# Patient Record
Sex: Female | Born: 1945 | Race: White | Hispanic: No
Health system: Southern US, Community
[De-identification: ages and names within clinical notes are randomized; demographics above are authoritative.]

## PROBLEM LIST (undated history)

## (undated) DIAGNOSIS — H3530 Unspecified macular degeneration: Secondary | ICD-10-CM

## (undated) DIAGNOSIS — E079 Disorder of thyroid, unspecified: Secondary | ICD-10-CM

## (undated) DIAGNOSIS — T7840XA Allergy, unspecified, initial encounter: Secondary | ICD-10-CM

## (undated) HISTORY — DX: Unspecified macular degeneration: H35.30

## (undated) HISTORY — PX: KNEE ARTHROSCOPY W/ MENISCAL REPAIR: SHX1877

## (undated) HISTORY — PX: ABDOMINAL HYSTERECTOMY: SHX81

## (undated) HISTORY — DX: Disorder of thyroid, unspecified: E07.9

## (undated) HISTORY — DX: Allergy, unspecified, initial encounter: T78.40XA

---

## 2004-11-29 ENCOUNTER — Ambulatory Visit: Payer: Self-pay | Admitting: Internal Medicine

## 2006-01-13 ENCOUNTER — Ambulatory Visit: Payer: Self-pay | Admitting: Podiatry

## 2007-01-19 ENCOUNTER — Ambulatory Visit: Payer: Self-pay | Admitting: Orthopaedic Surgery

## 2007-01-25 ENCOUNTER — Ambulatory Visit: Payer: Self-pay | Admitting: Orthopaedic Surgery

## 2007-02-12 ENCOUNTER — Encounter: Payer: Self-pay | Admitting: Orthopaedic Surgery

## 2007-02-15 ENCOUNTER — Encounter: Payer: Self-pay | Admitting: Orthopaedic Surgery

## 2017-08-24 ENCOUNTER — Ambulatory Visit: Payer: BC Managed Care – PPO | Admitting: Oncology

## 2017-09-14 ENCOUNTER — Encounter: Payer: Self-pay | Admitting: Family Medicine

## 2018-09-06 ENCOUNTER — Encounter: Payer: BC Managed Care – PPO | Admitting: Oncology

## 2018-10-25 ENCOUNTER — Encounter: Payer: BC Managed Care – PPO | Admitting: Hematology and Oncology

## 2018-10-25 ENCOUNTER — Inpatient Hospital Stay: Payer: Medicare Other | Attending: Hematology and Oncology | Admitting: Hematology and Oncology

## 2018-10-25 ENCOUNTER — Encounter: Payer: Self-pay | Admitting: Hematology and Oncology

## 2018-10-25 ENCOUNTER — Other Ambulatory Visit: Payer: Self-pay

## 2018-10-25 ENCOUNTER — Inpatient Hospital Stay: Payer: Medicare Other

## 2018-10-25 VITALS — BP 148/92 | HR 76 | Temp 97.9°F | Resp 16 | Ht 64.0 in | Wt 181.7 lb

## 2018-10-25 DIAGNOSIS — H3530 Unspecified macular degeneration: Secondary | ICD-10-CM | POA: Diagnosis not present

## 2018-10-25 DIAGNOSIS — Z79899 Other long term (current) drug therapy: Secondary | ICD-10-CM | POA: Insufficient documentation

## 2018-10-25 DIAGNOSIS — D696 Thrombocytopenia, unspecified: Secondary | ICD-10-CM | POA: Insufficient documentation

## 2018-10-25 DIAGNOSIS — K219 Gastro-esophageal reflux disease without esophagitis: Secondary | ICD-10-CM | POA: Insufficient documentation

## 2018-10-25 DIAGNOSIS — Z7982 Long term (current) use of aspirin: Secondary | ICD-10-CM | POA: Insufficient documentation

## 2018-10-25 DIAGNOSIS — E063 Autoimmune thyroiditis: Secondary | ICD-10-CM | POA: Diagnosis not present

## 2018-10-25 DIAGNOSIS — R0609 Other forms of dyspnea: Secondary | ICD-10-CM | POA: Insufficient documentation

## 2018-10-25 LAB — TECHNOLOGIST SMEAR REVIEW

## 2018-10-25 LAB — PROTIME-INR
INR: 0.96
Prothrombin Time: 12.7 seconds (ref 11.4–15.2)

## 2018-10-25 LAB — CBC WITH DIFFERENTIAL/PLATELET
Abs Immature Granulocytes: 0.01 10*3/uL (ref 0.00–0.07)
Basophils Absolute: 0 10*3/uL (ref 0.0–0.1)
Basophils Relative: 1 %
EOS ABS: 0.1 10*3/uL (ref 0.0–0.5)
Eosinophils Relative: 2 %
HCT: 41.1 % (ref 36.0–46.0)
Hemoglobin: 14.1 g/dL (ref 12.0–15.0)
Immature Granulocytes: 0 %
Lymphocytes Relative: 23 %
Lymphs Abs: 0.9 10*3/uL (ref 0.7–4.0)
MCH: 31.3 pg (ref 26.0–34.0)
MCHC: 34.3 g/dL (ref 30.0–36.0)
MCV: 91.3 fL (ref 80.0–100.0)
Monocytes Absolute: 0.4 10*3/uL (ref 0.1–1.0)
Monocytes Relative: 9 %
Neutro Abs: 2.5 10*3/uL (ref 1.7–7.7)
Neutrophils Relative %: 65 %
Platelets: 149 10*3/uL — ABNORMAL LOW (ref 150–400)
RBC: 4.5 MIL/uL (ref 3.87–5.11)
RDW: 12.6 % (ref 11.5–15.5)
WBC: 3.9 10*3/uL — ABNORMAL LOW (ref 4.0–10.5)
nRBC: 0 % (ref 0.0–0.2)

## 2018-10-25 LAB — COMPREHENSIVE METABOLIC PANEL
ALT: 25 U/L (ref 0–44)
AST: 27 U/L (ref 15–41)
Albumin: 4.4 g/dL (ref 3.5–5.0)
Alkaline Phosphatase: 96 U/L (ref 38–126)
Anion gap: 8 (ref 5–15)
BUN: 18 mg/dL (ref 8–23)
CALCIUM: 9.1 mg/dL (ref 8.9–10.3)
CO2: 29 mmol/L (ref 22–32)
Chloride: 102 mmol/L (ref 98–111)
Creatinine, Ser: 0.73 mg/dL (ref 0.44–1.00)
GFR calc Af Amer: >60 mL/min (ref >60)
GFR calc non Af Amer: >60 mL/min (ref >60)
Glucose, Bld: 121 mg/dL — ABNORMAL HIGH (ref 70–99)
Potassium: 4.2 mmol/L (ref 3.5–5.1)
Sodium: 139 mmol/L (ref 135–145)
Total Bilirubin: 0.9 mg/dL (ref 0.3–1.2)
Total Protein: 7.4 g/dL (ref 6.5–8.1)

## 2018-10-25 LAB — RETICULOCYTES
Immature Retic Fract: 10.8 % (ref 2.3–15.9)
RBC.: 4.52 MIL/uL (ref 3.87–5.11)
Retic Count, Absolute: 90.4 10*3/uL (ref 19.0–186.0)
Retic Ct Pct: 2 % (ref 0.4–3.1)

## 2018-10-25 LAB — FOLATE: Folate: 43 ng/mL (ref >5.9)

## 2018-10-25 LAB — VITAMIN B12: Vitamin B-12: 571 pg/mL (ref 180–914)

## 2018-10-25 NOTE — Progress Notes (Signed)
[facility]-  [facility]cer Center  Clinic day:  10/25/2018  Chief Complaint: Colleen Mosley is a 73 y.o. female with thrombocytopenia who is referred in consultation by Dr. Leim Fabry for assessment and management.  HPI:   The patient has had a history of thrombocytopenia dating back to 03/25/2015.  Platelet count has ranged between 133,000 - 149,000.  She denies any bruising or bleeding.  CBC on 08/13/2018 revealed a hematocrit of 39.7, hemoglobin 13.6, MCV 92, platelets 137,000, WBC 4000 with an ANC of 2500.  Differential diagnosis included 64% segs, 21% lymphs, 12% monocytes,and 2% eosinophils.  CMP included a creatinine 0.7, albumin 4.1, protein 6.7, and normal LFTs.  TSH was 1.37.  B12 was 664 on 03/29/2018.  Hepatitis C antibody was negative on 03/12/2014.  Patient takes various nutritional supplement products. She denies any quinine water.  She is on Avastin injections for macular degeneration. She has hypothyroidism and takes levothyroxine (started as Hashimoto's).   She has chronic allergies and bronchitis.  Symptomatically, patient has exertional dyspnea. She notes more difficulties with hiking. Routine echocardiogram was done on 10/24/2018 by Dr. Lady Gary. Patient advises that she was told that it was "normal".  Patient has a history of recurrent upper respiratory infections.  She notes age related arthritic pain. Patient denies that she has experienced any B symptoms. She denies any interval infections.   Patient received a post-partum blood transfusion 50 years ago.   She has never had hepatitis.  Patient advises that she maintains an adequate appetite. She is eating well. Weight today is 181 lb 10.5 oz (82.4 kg).  Patient denies pain in the clinic today.   Past Medical History:  Diagnosis Date  . Allergy   . Macular degeneration   . Thyroid disease     Past Surgical History:  Procedure Laterality Date  . ABDOMINAL HYSTERECTOMY    . KNEE ARTHROSCOPY W/  MENISCAL REPAIR Right     Family History  Problem Relation Age of Onset  . Cancer Mother   . Cancer Maternal Aunt     Social History:  reports that she does not drink alcohol or use drugs. No history on file for tobacco.  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  She likes to go walking and hiking.  She has 3 dogs.  The patient lives in [place].  The patient is alone today.  Allergies:  Allergies  Allergen Reactions  . Coconut Fatty Acids Itching  . Fennel [Foeniculum Vulgare] Itching  . Iron Other (See Comments)    GI Upset  . Morphine Other (See Comments)    Hallucinations   . Rosemary Oil Itching  . Thyme Itching  . Other Nausea And Vomiting    Most narcotics  . Penicillins Rash    Current Medications: Current Outpatient Medications  Medication Sig Dispense Refill  . acetaminophen (TYLENOL) 325 MG tablet Take 650 mg by mouth every 6 (six) hours as needed.    Marland Kitchen acyclovir (ZOVIRAX) 400 MG tablet Take 400 mg by mouth as needed.     Marland Kitchen albuterol (PROVENTIL HFA;VENTOLIN HFA) 108 (90 Base) MCG/ACT inhaler Inhale into the lungs.    Marland Kitchen aspirin 81 MG chewable tablet Chew 81 mg by mouth daily.     Marland Kitchen atorvastatin (LIPITOR) 20 MG tablet Take 20 mg by mouth at bedtime.     . bevacizumab (AVASTIN) 400 MG/16ML SOLN 25 mg by Intravitreal route as directed.    . Cholecalciferol (VITAMIN D3 PO) Use as directed 2,000  Units in the mouth or throat daily.    . Digestive Enzymes (DIGESTIVE ENZYME PO) Use as directed 1 tablet in the mouth or throat 2 (two) times daily.    Marland Kitchen ipratropium (ATROVENT) 0.03 % nasal spray Place 2 sprays into the nose 2 (two) times daily.    Marland Kitchen levothyroxine (SYNTHROID, LEVOTHROID) 75 MCG tablet Take 75 mcg by mouth daily.     . montelukast (SINGULAIR) 10 MG tablet Take 1 tablet by mouth daily.    . Multiple Vitamin (MULTIVITAMIN) capsule Take 1 capsule by mouth daily.    . Multiple Vitamins-Minerals (PRESERVISION AREDS 2) CAPS Take 1 tablet by mouth 2 (two) times daily.     . Omega-3 1000 MG CAPS Take 1,000 mg by mouth daily.    Marland Kitchen omeprazole (PRILOSEC) 20 MG capsule Take 20 mg by mouth as needed.     . traZODone (DESYREL) 50 MG tablet Take 50 mg by mouth at bedtime as needed.     . triamcinolone cream (KENALOG) 0.1 % Apply 1 application topically as needed.    . Calcium-Magnesium-Vitamin D (CALCIUM 1200+D3 PO) Take 1 tablet by mouth daily.    Marland Kitchen zinc gluconate 50 MG tablet Take 50 mg by mouth daily.     No current facility-administered medications for this visit.     Review of Systems:  GENERAL:  Feels good.  Active.  No fevers, sweats.  Trying to lose weight. PERFORMANCE STATUS (ECOG):  1 HEENT:  Allergies.  No visual changes, sore throat, mouth sores or tenderness. Lungs: Shortness of breath with exertion.  No cough.  No hemoptysis. Cardiac:  No chest pain, palpitations, orthopnea, or PND.  Echo "fine". GI:  Reflux.  No nausea, vomiting, diarrhea, constipation, melena or hematochezia.  Colonoscopy 1 year ago. GU:  No urgency, frequency, dysuria, or hematuria. Musculoskeletal:  No back pain.  Arthritis right foot, hip, and hands (right > left).  No muscle tenderness.  Osteopenia. Extremities:  No pain or swelling. Skin:  No rashes or skin changes. Neuro:  Headache at times.  No numbness or weakness, balance or coordination issues. Endocrine:  No diabetes.  Hashimoto's thyroiditis on Synthroid.  No hot flashes or night sweats. Psych:  No mood changes, depression or anxiety. Pain:  No focal pain. Review of systems:  All other systems reviewed and found to be negative.  Physical Exam: Blood pressure (!) 148/92, pulse 76, temperature 97.9 F (36.6 C), temperature source Tympanic, resp. rate 16, height 5\' 4"  (1.626 m), weight 181 lb 10.5 oz (82.4 kg), SpO2 97 %. GENERAL:  Well developed, well nourished, woman sitting comfortably in the exam room in no acute distress. MENTAL STATUS:  Alert and oriented to person, place and time. HEAD:  Long dark hair.   Normocephalic, atraumatic, face symmetric, no Cushingoid features. EYES:  Glasses.  Blue eyes.  Pupils equal round and reactive to light and accomodation.  No conjunctivitis or scleral icterus. ENT:  Oropharynx clear without lesion.  Tongue normal. Mucous membranes moist.  RESPIRATORY:  Clear to auscultation without rales, wheezes or rhonchi. CARDIOVASCULAR:  Regular rate and rhythm without murmur, rub or gallop. ABDOMEN:  Soft, non-tender, with active bowel sounds, and no hepatosplenomegaly.  No masses. SKIN:  No rashes, ulcers or lesions. EXTREMITIES: No edema, no skin discoloration or tenderness.  No palpable cords. LYMPH NODES: No palpable cervical, supraclavicular, axillary or inguinal adenopathy. NEUROLOGICAL: Unremarkable. PSYCH:  Appropriate.   Office Visit on 10/25/2018  Component Date Value Ref Range Status  . Tech Review  10/25/2018 MORPHOLOGY UNREMARKABLE   Final   Performed at [facility], [address] [place], [place] [address]  . HCV Ab 10/25/2018 <0.1  0.0 - 0.9 s/co ratio Final   Comment: (NOTE)                                  Negative:     < 0.8                             Indeterminate: 0.8 - 0.9                                  Positive:     > 0.9 The CDC recommends that a positive HCV antibody result be followed up with a HCV Nucleic Acid Amplification test (604540). Performed At: [facility] [address] [place], [place] [id number] Jolene Schimke MD [id number]   . Hep B Core Total Ab 10/25/2018 Negative  Negative Final   Comment: (NOTE) Performed At: [facility] [address] [place], [place] [id number] Jolene Schimke MD [id number]   . HIV Screen 4th Generation wRfx 10/25/2018 Non Reactive  Non Reactive Final   Comment: (NOTE) Performed At: [facility] [address] [place], [place] [id number] Jolene Schimke MD [id number]   . Folate 10/25/2018 43.0  >5.9 ng/mL Final   Performed at [facility], [address] [place]., [place], [place] [address]  . Vitamin B-12 10/25/2018 571  180 - 914 pg/mL Final   Comment: (NOTE) This assay is not validated for testing neonatal or myeloproliferative syndrome specimens for Vitamin B12 levels. Performed at [facility], [address], [place], [place] [address]   . Anti Nuclear Antibody(ANA) 10/25/2018 Negative  Negative Final   Comment: (NOTE) Performed At: [facility] [address] [place], [place] [id number] Jolene Schimke MD [id number]   . Prothrombin Time 10/25/2018 12.7  11.4 - 15.2 seconds Final  . INR 10/25/2018 0.96   Final   Performed at [facility], [address] [place] [place], [place] [address]  . Sodium 10/25/2018 139  135 - 145 mmol/L Final  . Potassium 10/25/2018 4.2  3.5 - 5.1 mmol/L Final  . Chloride 10/25/2018 102  98 - 111 mmol/L Final  . CO2 10/25/2018 29  22 - 32 mmol/L Final  . Glucose, Bld 10/25/2018 121* 70 - 99 mg/dL Final  . BUN 42/70/6237 18  8 - 23 mg/dL Final  . Creatinine, Ser 10/25/2018 0.73  0.44 - 1.00 mg/dL Final  . Calcium 62/83/1517 9.1  8.9 - 10.3 mg/dL Final  . Total Protein 10/25/2018 7.4  6.5 - 8.1 g/dL Final  . Albumin 61/60/7371 4.4  3.5 - 5.0 g/dL Final  . AST 04/11/9484 27  15 - 41 U/L Final  . ALT 10/25/2018 25  0 - 44 U/L Final  . Alkaline Phosphatase 10/25/2018 96  38 - 126 U/L Final  . Total Bilirubin 10/25/2018 0.9  0.3 - 1.2 mg/dL Final  . GFR calc non Af Amer 10/25/2018 >60  >60 mL/min Final  . GFR calc Af Amer 10/25/2018 >60  >60 mL/min Final  . Anion gap 10/25/2018 8  5 - 15 Final   Performed at [facility], [address] [place], [place] [address]  . WBC 10/25/2018 3.9*  4.0 - 10.5 K/uL Final  . RBC 10/25/2018 4.50  3.87 - 5.11 MIL/uL Final  . Hemoglobin 10/25/2018 14.1  12.0 - 15.0 g/dL Final  . HCT 20/94/7096 41.1  36.0 - 46.0 % Final  . MCV 10/25/2018 91.3  80.0 - 100.0 fL Final  . MCH 10/25/2018 31.3  26.0 - 34.0 pg Final  .  MCHC 10/25/2018 34.3  30.0 - 36.0 g/dL Final  . RDW 28/36/6294 12.6  11.5 - 15.5 % Final  . Platelets 10/25/2018 149* 150 - 400 K/uL Final   PLATELET BY CITRATE NOT NECESSARY EDTA CLUMPING not OBSERVED.  Marland Kitchen nRBC 10/25/2018 0.0  0.0 - 0.2 % Final  . Neutrophils Relative % 10/25/2018 65  % Final  . Neutro Abs 10/25/2018 2.5  1.7 - 7.7 K/uL Final  . Lymphocytes Relative 10/25/2018 23  % Final  . Lymphs Abs 10/25/2018 0.9  0.7 - 4.0 K/uL Final  . Monocytes Relative 10/25/2018 9  % Final  . Monocytes Absolute 10/25/2018 0.4  0.1 - 1.0 K/uL Final  . Eosinophils Relative 10/25/2018 2  % Final  . Eosinophils Absolute 10/25/2018 0.1  0.0 - 0.5 K/uL Final  . Basophils Relative 10/25/2018 1  % Final  . Basophils Absolute 10/25/2018 0.0  0.0 - 0.1 K/uL Final  . Immature Granulocytes 10/25/2018 0  % Final  . Abs Immature Granulocytes 10/25/2018 0.01  0.00 - 0.07 K/uL Final   Performed at [facility], [address] [place], [place] [address]  . Retic Ct Pct 10/25/2018 2.0  0.4 - 3.1 % Final  . RBC. 10/25/2018 4.52  3.87 - 5.11 MIL/uL Final  . Retic Count, Absolute 10/25/2018 90.4  19.0 - 186.0 K/uL Final  . Immature Retic Fract 10/25/2018 10.8  2.3 - 15.9 % Final   Performed at [facility], [address], [place], [place] [address]  . H Pylori IgG 10/25/2018 0.27  0.00 - 0.79 Index Value Final   Comment: (NOTE)                             Negative           <0.80                             Equivocal    0.80 - 0.89                             Positive           >0.89 Performed At: [facility] [address] [place] [address] [place], [place] [id number] Jolene Schimke MD [id number]     Assessment:  Colleen Mosley is a 73 y.o. female with mild thrombocytopenia dating back to at least 03/25/2015.  Platelet count has ranged between 133,000 - 149,000 without trend.  She has a history of Hashimoto's thyroiditis.  She denies any new medications.  She takes some supplements.   She denies any history of hepatitis.  She had a blood transfusion 50 years ago.  She denies any alcohol.  Diet is good.  She has reflux.  Symptomatically, she denies any B symptoms.  She denies any bruising or bleeding.  Exam is reveals no adenopathy or hepatosplenomegaly.  Plan: 1.   Labs today:  CBC with diff, CMP, platelet count in a blue top tube, PT, PTT, ANA with reflex,  B12, folate, H pylori serologies, HIV testing, hepatitis B core antibody, hepatitis C antibody. 2.   Peripheral smear for path review. 3.   Mild thrombocytopenia  Etiology unclear.  Discuss differential diagnosis including pseudothrombocytopenia, vitamin deficiencies, autoimmune, infections (hepatitis, HIV, H pylori).  Etiology felt likely due to autoimmune disease secondary to history of Hashimoto's thyroiditis. 4.   RTC in 1 week for MD assessment and review of work-up.  Quentin MullingBryan Gray, NP  10/25/2018, 10:06 AM   I saw and evaluated the patient, participating in the key portions of the service and reviewing pertinent diagnostic studies and records.  I reviewed the nurse practitioner's note and agree with the findings and the plan.  The assessment and plan were discussed with the patient.  Multiple questions were asked by the patient and answered.   Nelva NayMelissa Corcoran, MD 10/25/2018,10:06 AM

## 2018-10-25 NOTE — Progress Notes (Signed)
Pt. Here for new patient visit d/t abnormal platelet counts, referred by Dr. Dayna Barker.

## 2018-10-26 LAB — ANA W/REFLEX: Anti Nuclear Antibody(ANA): NEGATIVE

## 2018-10-26 LAB — H. PYLORI ANTIBODY, IGG: H Pylori IgG: 0.27 Index Value (ref 0.00–0.79)

## 2018-10-26 LAB — HEPATITIS C ANTIBODY: HCV Ab: <0.1 s/co ratio (ref 0.0–0.9)

## 2018-10-26 LAB — HEPATITIS B CORE ANTIBODY, TOTAL: Hep B Core Total Ab: NEGATIVE

## 2018-10-26 LAB — HIV ANTIBODY (ROUTINE TESTING W REFLEX): HIV Screen 4th Generation wRfx: NONREACTIVE

## 2018-10-31 ENCOUNTER — Encounter: Payer: Self-pay | Admitting: Hematology and Oncology

## 2018-10-31 ENCOUNTER — Inpatient Hospital Stay: Payer: Medicare Other | Admitting: Hematology and Oncology

## 2018-10-31 VITALS — BP 138/77 | HR 80 | Temp 99.1°F | Resp 16 | Wt 183.0 lb

## 2018-10-31 DIAGNOSIS — D696 Thrombocytopenia, unspecified: Secondary | ICD-10-CM | POA: Diagnosis not present

## 2018-10-31 NOTE — Progress Notes (Signed)
Pt here for f/u. Denies any complaints/concerns at this time.

## 2018-10-31 NOTE — Progress Notes (Signed)
[facility]  Clinic day:  10/31/2018  Chief Complaint: Colleen [facility] is a 73 y.o. female with thrombocytopenia who is seen for review of work-up and discussion regarding direction of therapy.  HPI:  The patient was last seen in the hematology clinic on 10/25/2018 for initial consultation.  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  Platelet count has ranged between 133,000 - 149,000.  She denied any bruising or bleeding.  She denied any B symptoms.  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  Work-up revealed a hematocrit of 41.1, hemoglobin 14.1, MCV 91.3, platelets 149,000, WBC 3900 with an ANC of 2500.  Negative studies included: H pylori, HIV antibody, hepatitis B core antibody total, hepatitis C serologies.  B12 and folate were normal.  Retic was 2%.  ANA was negative. PT was 12.7 with an INR of 0.96.  CMP was normal.  Peripheral smear was unremarkable.  During the interim, patient doing well. She denies any acute concerns. No bleeding or unexplained bruising. Patient denies that she has experienced any B symptoms. She denies any interval infections.   Patient advises that she maintains an adequate appetite. She is eating well. Weight today is 182 lb 15.7 oz (83 kg), which compared to her last visit to the clinic, represents a 1 pound increase.   Patient denies pain in the clinic today.   Past Medical History:  Diagnosis Date  . Allergy   . Macular degeneration   . Thyroid disease     Past Surgical History:  Procedure Laterality Date  . ABDOMINAL HYSTERECTOMY    . KNEE ARTHROSCOPY W/ MENISCAL REPAIR Right     Family History  Problem Relation Age of Onset  . Cancer Mother   . Cancer Maternal Aunt     Social History:  reports that she does not drink alcohol or use drugs. No history on file for tobacco.  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  She likes to go walking and hiking.  She has 3 dogs.  The patient lives in  [place]. The patient is alone today.  Allergies:  Allergies  Allergen Reactions  . Coconut Fatty Acids Itching  . Fennel [Foeniculum Vulgare] Itching  . Iron Other (See Comments)    GI Upset  . Morphine Other (See Comments)    Hallucinations   . Rosemary Oil Itching  . Thyme Itching  . Other Nausea And Vomiting    Most narcotics  . Penicillins Rash    Current Medications: Current Outpatient Medications  Medication Sig Dispense Refill  . acetaminophen (TYLENOL) 325 MG tablet Take 650 mg by mouth every 6 (six) hours as needed.    Marland Kitchen. acyclovir (ZOVIRAX) 400 MG tablet Take 400 mg by mouth as needed.     Marland Kitchen. aspirin 81 MG chewable tablet Chew 81 mg by mouth daily.     Marland Kitchen. atorvastatin (LIPITOR) 20 MG tablet Take 20 mg by mouth at bedtime.     . bevacizumab (AVASTIN) 400 MG/16ML SOLN 25 mg by Intravitreal route as directed.    . Calcium-Magnesium-Vitamin D (CALCIUM 1200+D3 PO) Take 1 tablet by mouth daily.    . Cholecalciferol (VITAMIN D3 PO) Use as directed 2,000 Units in the mouth or throat daily.    . Digestive Enzymes (DIGESTIVE ENZYME PO) Use as directed 1 tablet in the mouth or throat 2 (two) times daily.    Marland Kitchen. ipratropium (ATROVENT) 0.03 % nasal spray Place 2 sprays into the nose 2 (two)  times daily.    Marland Kitchen levothyroxine (SYNTHROID, LEVOTHROID) 75 MCG tablet Take 75 mcg by mouth daily.     . montelukast (SINGULAIR) 10 MG tablet Take 1 tablet by mouth daily.    . Multiple Vitamin (MULTIVITAMIN) capsule Take 1 capsule by mouth daily.    . Multiple Vitamins-Minerals (PRESERVISION AREDS 2) CAPS Take 1 tablet by mouth 2 (two) times daily.    . Omega-3 1000 MG CAPS Take 1,000 mg by mouth daily.    . traZODone (DESYREL) 50 MG tablet Take 50 mg by mouth at bedtime as needed.     . zinc gluconate 50 MG tablet Take 50 mg by mouth daily.    Marland Kitchen albuterol (PROVENTIL HFA;VENTOLIN HFA) 108 (90 Base) MCG/ACT inhaler Inhale into the lungs.    Marland Kitchen omeprazole (PRILOSEC) 20 MG capsule Take 20 mg by mouth as  needed.     . triamcinolone cream (KENALOG) 0.1 % Apply 1 application topically as needed.     No current facility-administered medications for this visit.     Review of Systems:  GENERAL:  Feels good.  Active.  No fevers, sweats.  Weight up 1 pound. PERFORMANCE STATUS (ECOG):  0 HEENT:  Allergies.  No visual changes, sore throat, mouth sores or tenderness. Lungs: Shortness of breath with exertion.  No cough.  No hemoptysis. Cardiac:  No chest pain, palpitations, orthopnea, or PND. GI:  Reflux.  No nausea, vomiting, diarrhea, constipation, melena or hematochezia. GU:  No urgency, frequency, dysuria, or hematuria. Musculoskeletal:  No back pain.  Arthritis right foot, hip, and hands.  No muscle tenderness. Extremities:  No pain or swelling. Skin:  No rashes or skin changes. Neuro:  No headache, numbness or weakness, balance or coordination issues. Endocrine:  No diabetes.  Hashimoto's thyroiditis on Synthroid.  No hot flashes or night sweats. Psych:  No mood changes, depression or anxiety. Pain:  No focal pain. Review of systems:  All other systems reviewed and found to be negative.   Physical Exam: Blood pressure 138/77, pulse 80, temperature 99.1 F (37.3 C), temperature source Tympanic, resp. rate 16, weight 182 lb 15.7 oz (83 kg), SpO2 98 %. GENERAL:  Well developed, well nourished, woman sitting comfortably in the exam room in no acute distress. MENTAL STATUS:  Alert and oriented to person, place and time. HEAD:  Long dark hair.  Normocephalic, atraumatic, face symmetric, no Cushingoid features. EYES:  Glasses.  Blue eyes.  No conjunctivitis or scleral icterus. NEUROLOGICAL: Unremarkable. PSYCH:  Appropriate.    No visits with results within 3 Day(s) from this visit.  Latest known visit with results is:  Office Visit on 10/25/2018  Component Date Value Ref Range Status  . Tech Review 10/25/2018 MORPHOLOGY UNREMARKABLE   Final   Performed at [facility],  [address] [place], [place] [address]  . HCV Ab 10/25/2018 <0.1  0.0 - 0.9 s/co ratio Final   Comment: (NOTE)                                  Negative:     < 0.8                             Indeterminate: 0.8 - 0.9  Positive:     > 0.9 The CDC recommends that a positive HCV antibody result be followed up with a HCV Nucleic Acid Amplification test (185631). Performed At: [facility] [address] [place], [place] [id number] Jolene Schimke MD [id number]   . Hep B Core Total Ab 10/25/2018 Negative  Negative Final   Comment: (NOTE) Performed At: [facility] [address] [place], [place] [id number] Jolene Schimke MD [id number]   . HIV Screen 4th Generation wRfx 10/25/2018 Non Reactive  Non Reactive Final   Comment: (NOTE) Performed At: [facility] [address] [place], [place] [id number] Jolene Schimke MD [id number]   . Folate 10/25/2018 43.0  >5.9 ng/mL Final   Performed at [facility], [address] [place] [place], [place] [address]  . Vitamin B-12 10/25/2018 571  180 - 914 pg/mL Final   Comment: (NOTE) This assay is not validated for testing neonatal or myeloproliferative syndrome specimens for Vitamin B12 levels. Performed at [facility], [address] [place], [place] [address]   . Anti Nuclear Antibody(ANA) 10/25/2018 Negative  Negative Final   Comment: (NOTE) Performed At: [facility] [address] [place], [place] [id number] Jolene Schimke MD [id number]   . Prothrombin Time 10/25/2018 12.7  11.4 - 15.2 seconds Final  . INR 10/25/2018 0.96   Final   Performed at [facility], [address] [place] [place], [place] [address]  . Sodium 10/25/2018 139  135 - 145 mmol/L Final  . Potassium 10/25/2018 4.2  3.5 - 5.1 mmol/L Final  . Chloride 10/25/2018 102  98 - 111 mmol/L Final  . CO2 10/25/2018 29  22 - 32 mmol/L Final  . Glucose, Bld 10/25/2018 121* 70 - 99 mg/dL  Final  . BUN 07/09/3006 18  8 - 23 mg/dL Final  . Creatinine, Ser 10/25/2018 0.73  0.44 - 1.00 mg/dL Final  . Calcium 62/26/3335 9.1  8.9 - 10.3 mg/dL Final  . Total Protein 10/25/2018 7.4  6.5 - 8.1 g/dL Final  . Albumin 45/62/5638 4.4  3.5 - 5.0 g/dL Final  . AST 93/73/4287 27  15 - 41 U/L Final  . ALT 10/25/2018 25  0 - 44 U/L Final  . Alkaline Phosphatase 10/25/2018 96  38 - 126 U/L Final  . Total Bilirubin 10/25/2018 0.9  0.3 - 1.2 mg/dL Final  . GFR calc non Af Amer 10/25/2018 >60  >60 mL/min Final  . GFR calc Af Amer 10/25/2018 >60  >60 mL/min Final  . Anion gap 10/25/2018 8  5 - 15 Final   Performed at [facility], [address] [place], [place] [address]  . WBC 10/25/2018 3.9* 4.0 - 10.5 K/uL Final  . RBC 10/25/2018 4.50  3.87 - 5.11 MIL/uL Final  . Hemoglobin 10/25/2018 14.1  12.0 - 15.0 g/dL Final  . HCT 72/62/0355 41.1  36.0 - 46.0 % Final  . MCV 10/25/2018 91.3  80.0 - 100.0 fL Final  . MCH 10/25/2018 31.3  26.0 - 34.0 pg Final  . MCHC 10/25/2018 34.3  30.0 - 36.0 g/dL Final  . RDW 97/41/6384 12.6  11.5 - 15.5 % Final  . Platelets 10/25/2018 149* 150 - 400 K/uL Final   PLATELET BY CITRATE NOT NECESSARY EDTA CLUMPING not OBSERVED.  Marland Kitchen nRBC 10/25/2018 0.0  0.0 - 0.2 % Final  . Neutrophils Relative % 10/25/2018 65  % Final  . Neutro Abs 10/25/2018 2.5  1.7 - 7.7 K/uL Final  . Lymphocytes Relative 10/25/2018 23  %  Final  . Lymphs Abs 10/25/2018 0.9  0.7 - 4.0 K/uL Final  . Monocytes Relative 10/25/2018 9  % Final  . Monocytes Absolute 10/25/2018 0.4  0.1 - 1.0 K/uL Final  . Eosinophils Relative 10/25/2018 2  % Final  . Eosinophils Absolute 10/25/2018 0.1  0.0 - 0.5 K/uL Final  . Basophils Relative 10/25/2018 1  % Final  . Basophils Absolute 10/25/2018 0.0  0.0 - 0.1 K/uL Final  . Immature Granulocytes 10/25/2018 0  % Final  . Abs Immature Granulocytes 10/25/2018 0.01  0.00 - 0.07 K/uL Final   Performed at [facility], [address] [place], [place] [address]  . Retic Ct Pct 10/25/2018 2.0  0.4 - 3.1 % Final  . RBC. 10/25/2018 4.52  3.87 - 5.11 MIL/uL Final  . Retic Count, Absolute 10/25/2018 90.4  19.0 - 186.0 K/uL Final  . Immature Retic Fract 10/25/2018 10.8  2.3 - 15.9 % Final   Performed at [facility], [address] [place], [place] [address]  . H Pylori IgG 10/25/2018 0.27  0.00 - 0.79 Index Value Final   Comment: (NOTE)                             Negative           <0.80                             Equivocal    0.80 - 0.89                             Positive           >0.89 Performed At: [facility] [address] [place], [place] [id number] Jolene Schimke MD WG:[id number]     Assessment:  Lylia Karn is a 73 y.o. female with mild thrombocytopenia dating back to at least 03/25/2015.  Platelet count has ranged between 133,000 - 149,000 without trend.  Work-up on 10/25/2018 revealed a hematocrit of 41.1, hemoglobin 14.1, MCV 91.3, platelets 149,000, WBC 3900 with an ANC of 2500.  Negative studies included: H pylori, HIV antibody, hepatitis B core antibody total, hepatitis C serologies.  B12 and folate were normal.  Retic was 2%.  ANA was negative. PT was 12.7 with an INR of 0.96.  CMP was normal.  She has a history of Hashimoto's thyroiditis.  She denies any new medications.  She takes some supplements.  She denies any history of hepatitis.  She had a blood transfusion 50 years ago.  She denies any alcohol.  Diet is good.  She has reflux.  Symptomatically, she denies any complaint.  She denies any bleeding.  Exam is unremarkable.  Platelet count is 149,000.  Plan: 1.   Thrombocytopenia  Review work-up.  Labs unremarkable.  PTT inadvertantly not drawn.    Consider checking PTT at next lab draw.  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  Continue to monitor. 2.  RTC prn.   Quentin Mulling, NP  10/31/2018, 2:01 PM   I saw and evaluated the patient, participating in the key  portions of the service and reviewing pertinent diagnostic studies and records.  I reviewed the nurse practitioner's note and agree with the findings and the plan.  The assessment and plan were discussed with the patient.  Several questions were asked by the  patient and answered.   Nelva NayMelissa , MD 10/31/2018,2:01 PM

## 2018-12-21 ENCOUNTER — Encounter: Payer: Self-pay | Admitting: Family Medicine

## 2021-10-06 ENCOUNTER — Other Ambulatory Visit: Payer: Self-pay | Admitting: Otolaryngology

## 2021-10-06 DIAGNOSIS — H93A2 Pulsatile tinnitus, left ear: Secondary | ICD-10-CM

## 2021-11-03 ENCOUNTER — Other Ambulatory Visit: Payer: Medicare Other

## 2021-11-03 ENCOUNTER — Ambulatory Visit: Payer: Medicare PPO

## 2021-11-17 ENCOUNTER — Ambulatory Visit
Admission: RE | Admit: 2021-11-17 | Discharge: 2021-11-17 | Disposition: A | Discharge status: Home / Self Care | Payer: Medicare PPO | Source: Ambulatory Visit | Attending: Otolaryngology | Admitting: Otolaryngology

## 2021-11-17 ENCOUNTER — Other Ambulatory Visit: Payer: Self-pay

## 2021-11-17 DIAGNOSIS — H93A2 Pulsatile tinnitus, left ear: Secondary | ICD-10-CM | POA: Diagnosis present

## 2021-11-17 IMAGING — MR MR MRA HEAD W/O CM
1 series · 19 of 48 positions shown · IV contrast (gadavist)
Comparison: No pertinent prior exam.

CLINICAL DATA: Left-sided hearing loss with left-sided pulsatile
tinnitus for 9 months. Hypertension.

EXAM:
MRI HEAD WITHOUT AND WITH CONTRAST
MRA HEAD WITHOUT CONTRAST
TECHNIQUE: Multiplanar, multi-echo pulse sequences of the brain and surrounding
structures were acquired without and with intravenous contrast.
Angiographic images of the Circle of Willis were acquired using MRA
technique without intravenous contrast.
CONTRAST:  7mL GADAVIST GADOBUTROL 1 MMOL/ML IV SOLN

[Series 5: TOF · axial · 0.5mm · 0.41mm/px · z∈[-100,-3]mm · 19 of 205 slices shown]
[im 1/205]
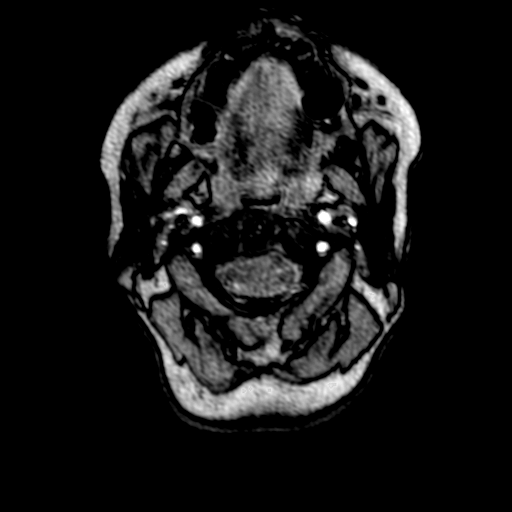
[im 5/205]
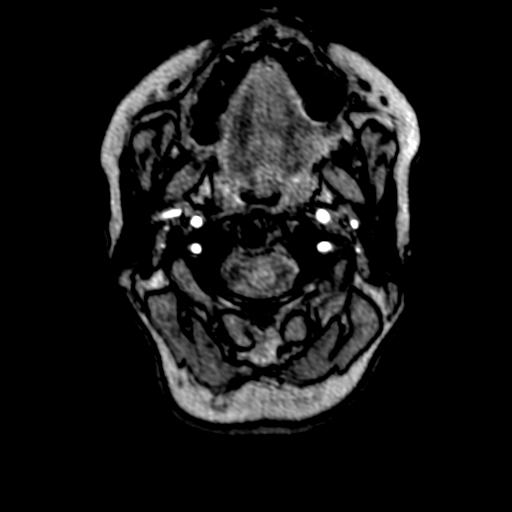
[im 9/205]
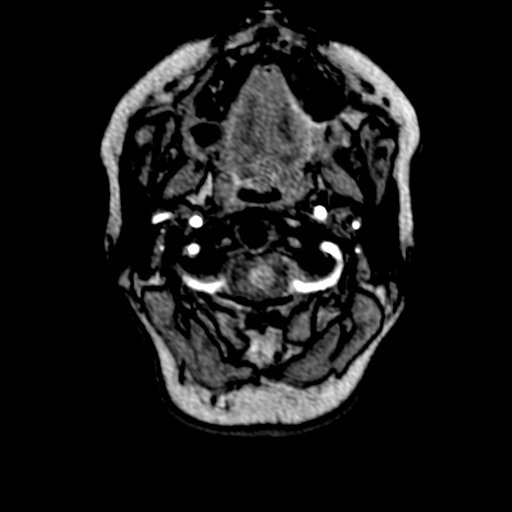
[im 14/205]
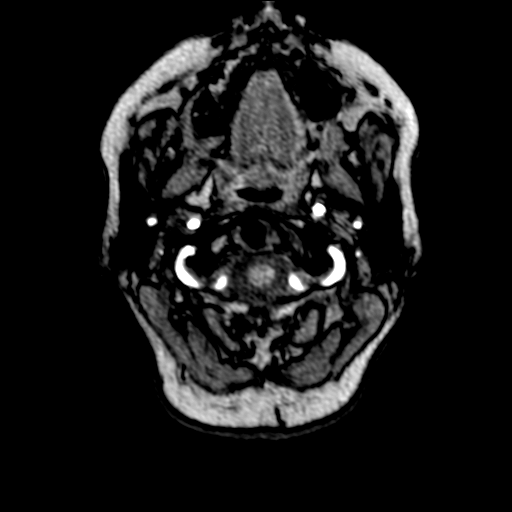
[im 18/205]
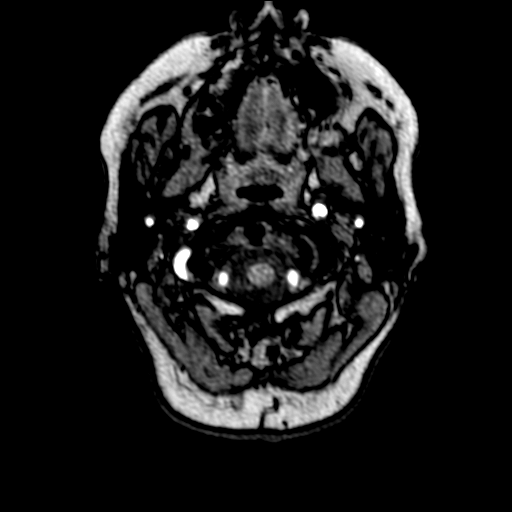
[im 22/205]
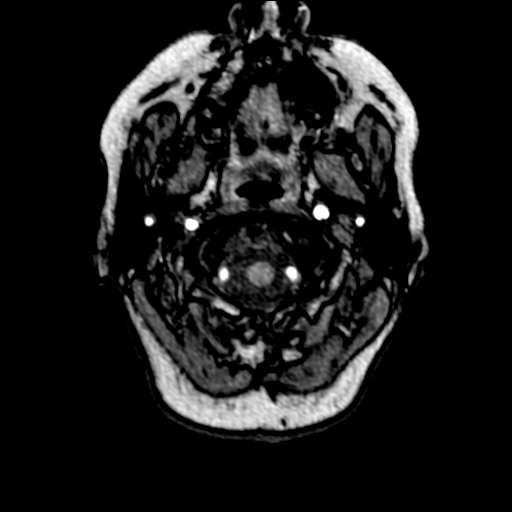
[im 27/205]
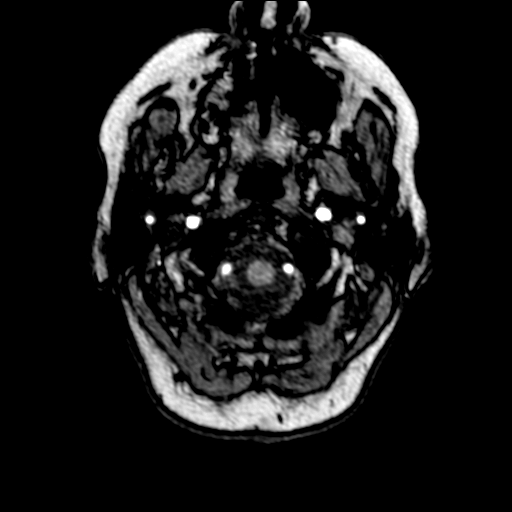
[im 31/205]
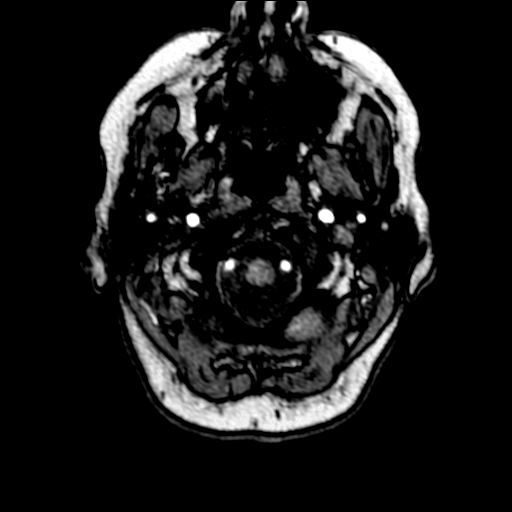
[im 35/205]
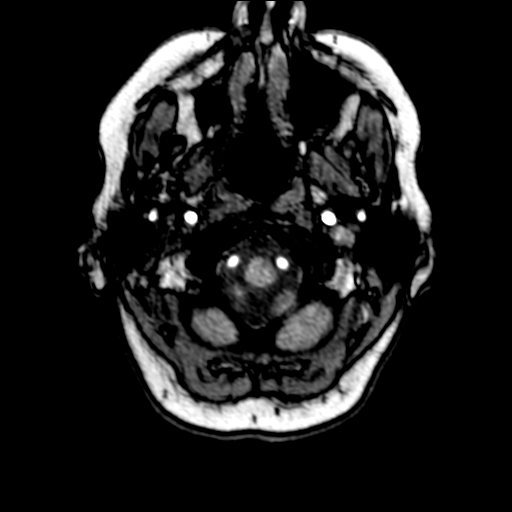
[im 40/205]
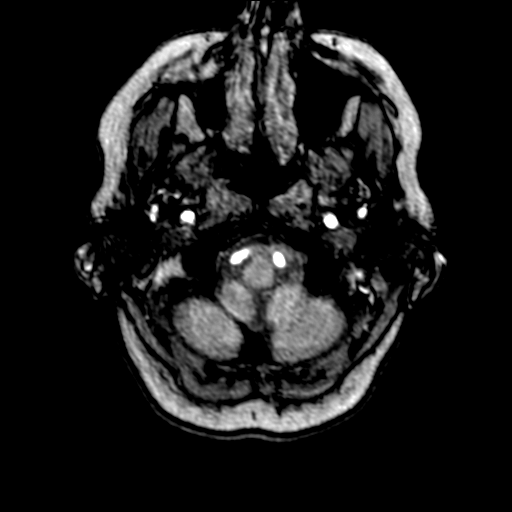
[im 44/205]
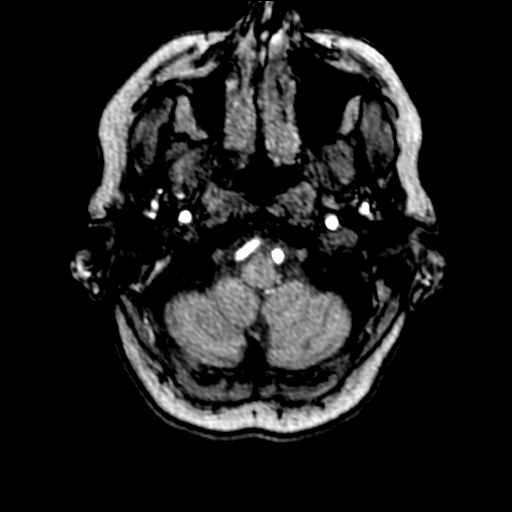
[im 66/205]
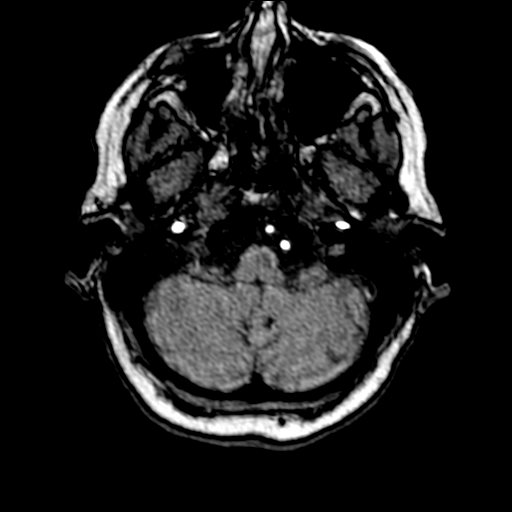
[im 92/205]
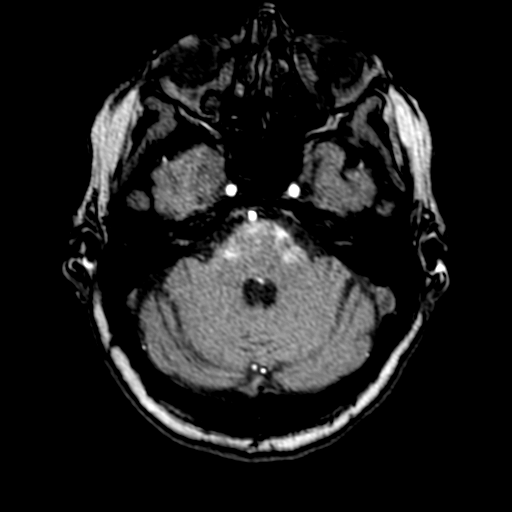
[im 105/205]
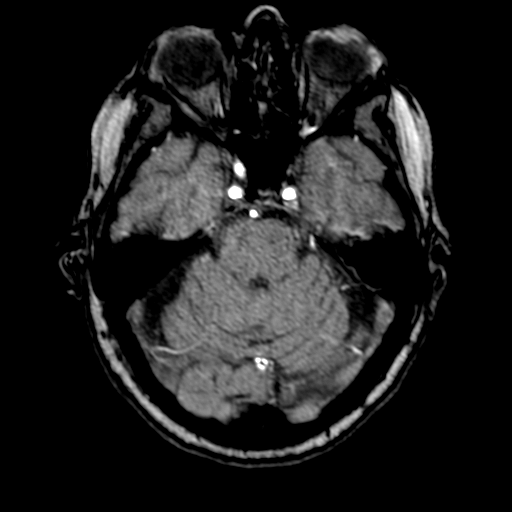
[im 118/205]
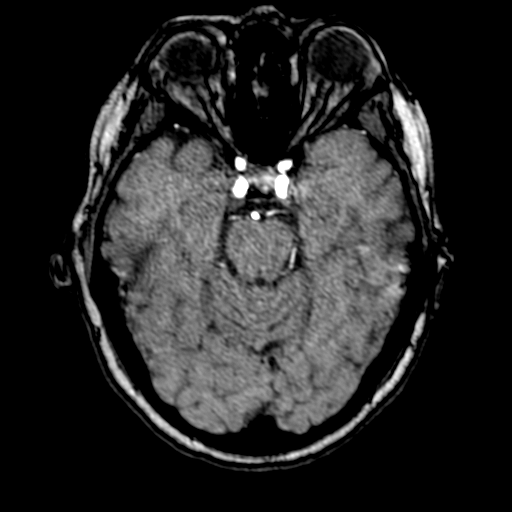
[im 144/205]
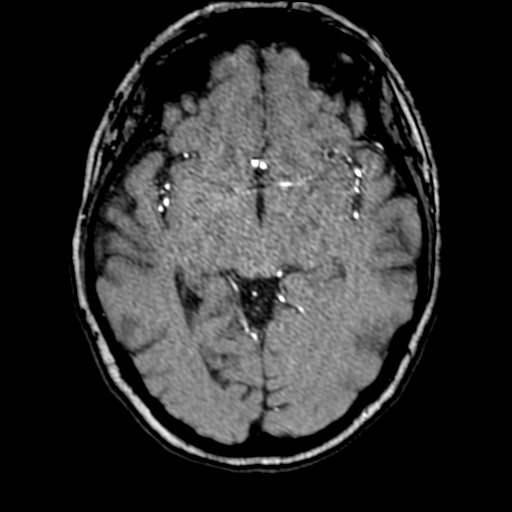
[im 170/205]
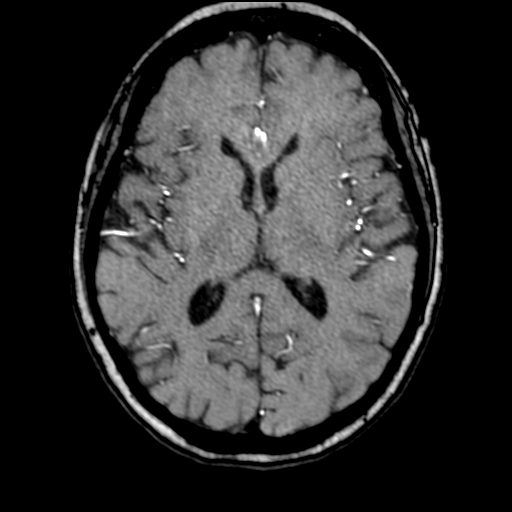
[im 174/205]
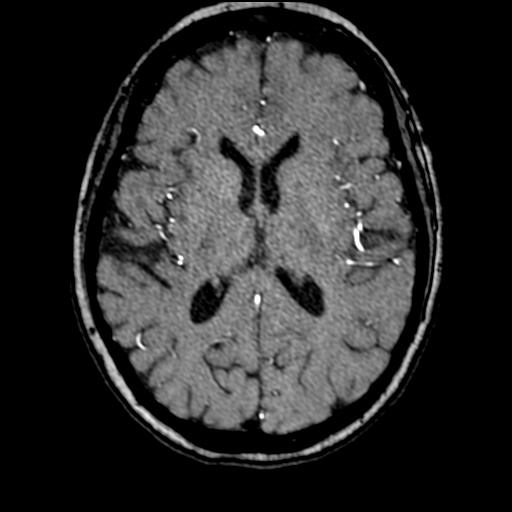
[im 196/205]
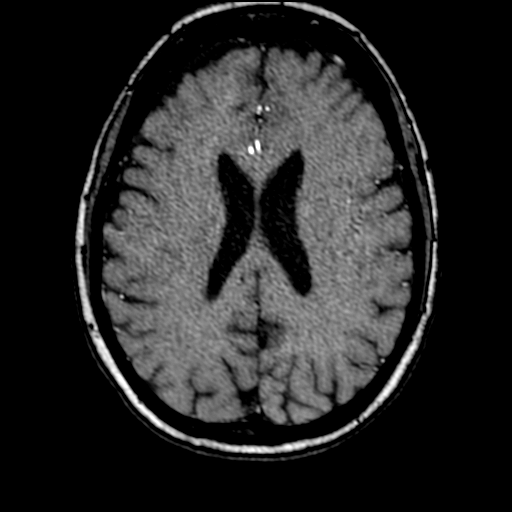

[19 of 48 positions shown; findings below may reference images not displayed]

FINDINGS: MRI HEAD FINDINGS

Brain: Symmetric normal labyrinthine signal. Normal appearance of
the brainstem and cisterns. No infarct, hemorrhage, hydrocephalus,
mass, or brain atrophy.

Vascular: Normal flow voids and vascular enhancements. No evidence
of dural sinus stenosis or diverticulum.

Skull and upper cervical spine: Normal marrow signal.

Sinuses/Orbits: Unremarkable

MRA HEAD FINDINGS

Anterior circulation: Vessels are smoothly contoured and widely
patent. Normal course of the carotids at the skull base. Negative
for aneurysm.

Posterior circulation: Codominant vertebral arteries. No aneurysm,
beading, or altered course.

No evidence of fistula.
IMPRESSION: No explanation for symptoms. No retrocochlear or vascular lesion as
a cause for tinnitus.

## 2021-11-17 IMAGING — MR MR HEAD WO/W CM
12 of 18 series · 28 of 48 positions shown · IV contrast (7ml Gadavist)
Comparison: No pertinent prior exam.

CLINICAL DATA: Left-sided hearing loss with left-sided pulsatile
tinnitus for 9 months. Hypertension.

EXAM:
MRI HEAD WITHOUT AND WITH CONTRAST
MRA HEAD WITHOUT CONTRAST
TECHNIQUE: Multiplanar, multi-echo pulse sequences of the brain and surrounding
structures were acquired without and with intravenous contrast.
Angiographic images of the Circle of Willis were acquired using MRA
technique without intravenous contrast.
CONTRAST:  7mL GADAVIST GADOBUTROL 1 MMOL/ML IV SOLN

[Series 5: ax dwi_tracew · axial · 3.0mm · 0.65mm/px · z∈[-91,+63]mm · 4 of 48 slices shown]
[im 1/48]
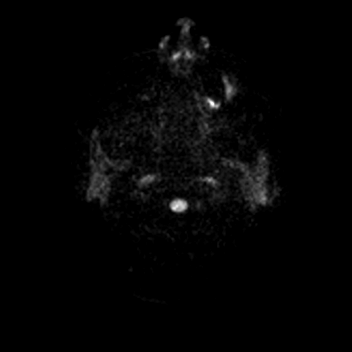
[im 16/48]
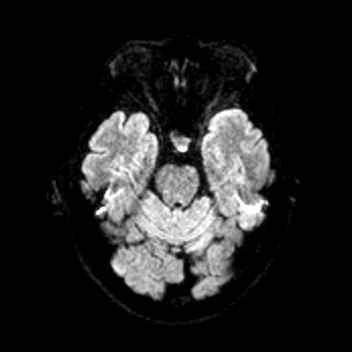
[im 32/48]
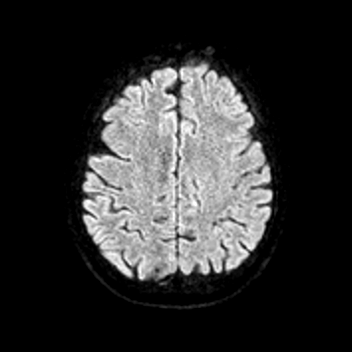
[im 48/48]
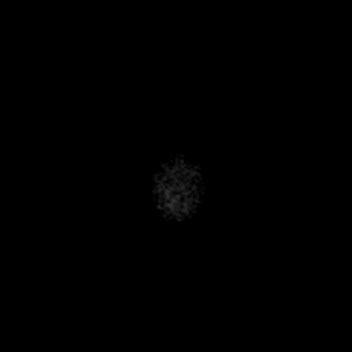

[Series 6: ax dwi_adc · axial · 3.0mm · 0.65mm/px · z∈[-91,+63]mm · 3 of 48 slices shown]
[im 1/48]
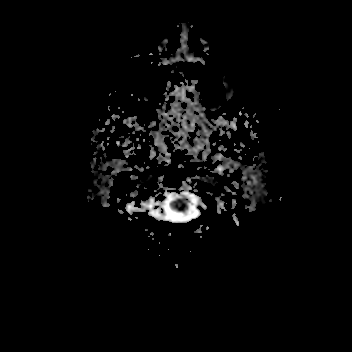
[im 24/48]
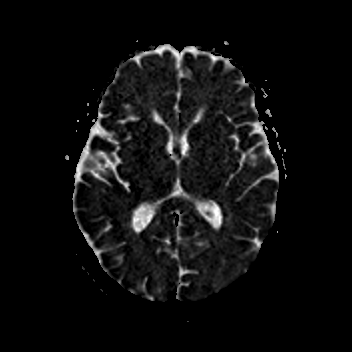
[im 48/48]
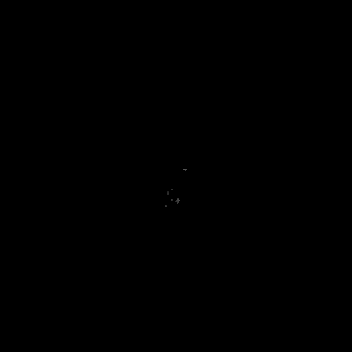

[Series 7: cor dwi_tracew · coronal · 5.0mm · 0.65mm/px · 2 of 40 slices shown]
[im 1/40]
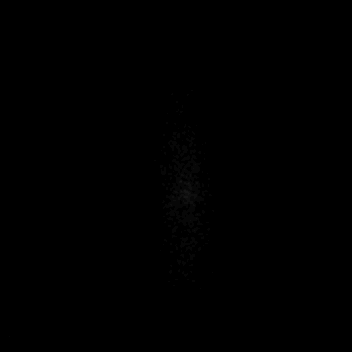
[im 40/40]
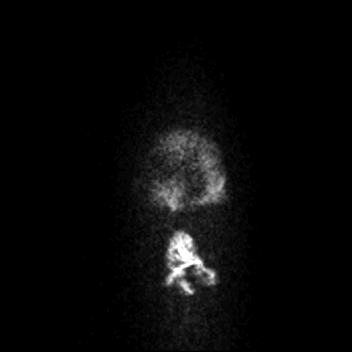

[Series 9: T1 · sagittal · 5.0mm · 0.62mm/px · 1 of 25 slices shown (1 of 3)]
[im 1/25]
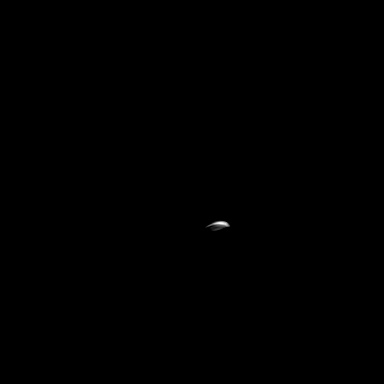

[Series 10: T2 · axial · 5.0mm · 0.53mm/px · 1 of 25 slices shown]
[im 1/25]
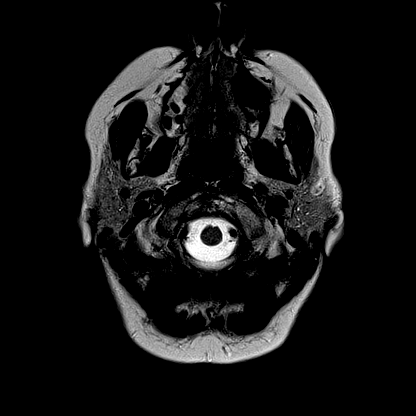

[Series 15: FLAIR · axial · 3.0mm · 0.53mm/px · z∈[-95,+66]mm · 3 of 55 slices shown]
[im 1/55]
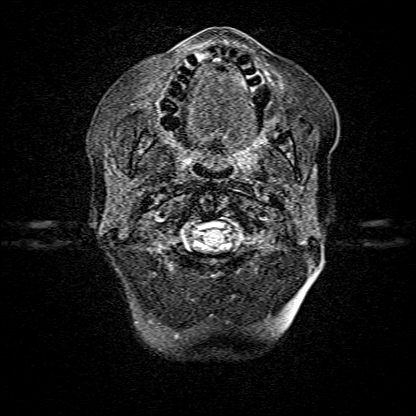
[im 28/55]
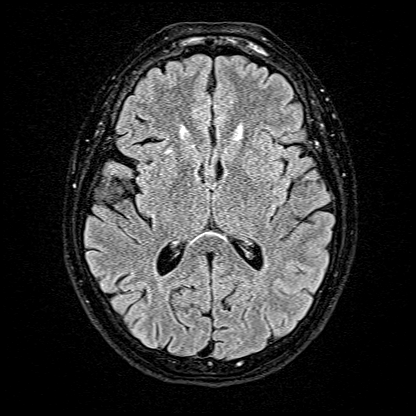
[im 55/55]
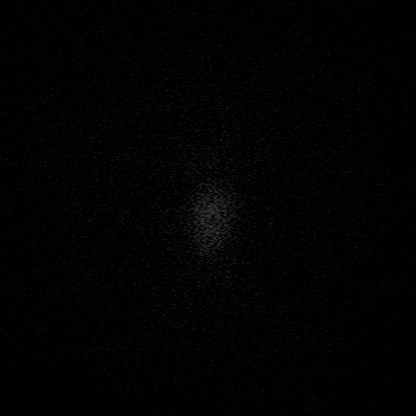

[Series 17: T1 · axial · non-contrast · 3.0mm · 0.21mm/px · 1 of 15 slices shown (2 of 3)]
[im 1/15]
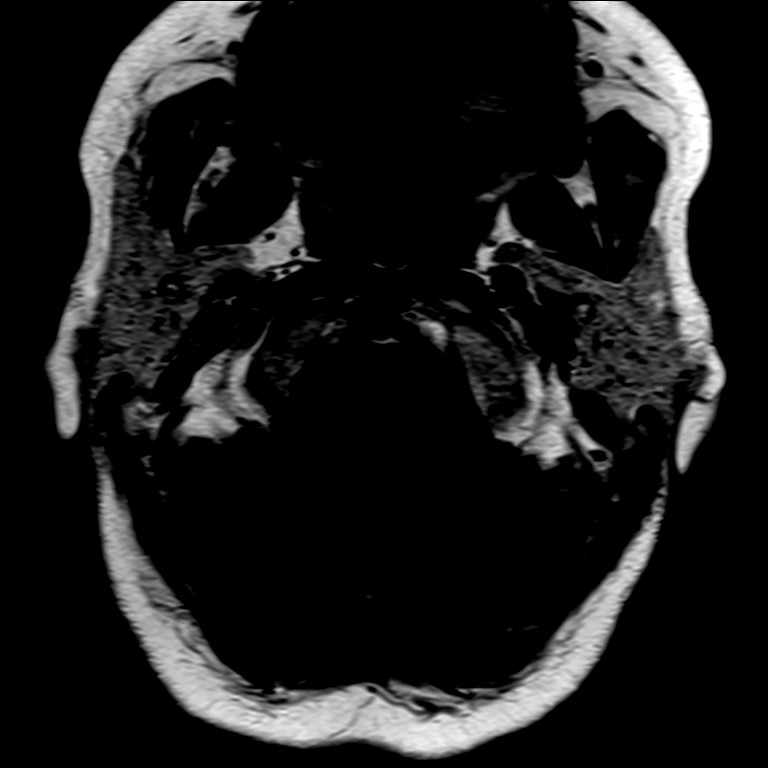

[Series 18: T1 · coronal · non-contrast · 3.0mm · 0.21mm/px · 1 of 13 slices shown (3 of 3)]
[im 1/13]
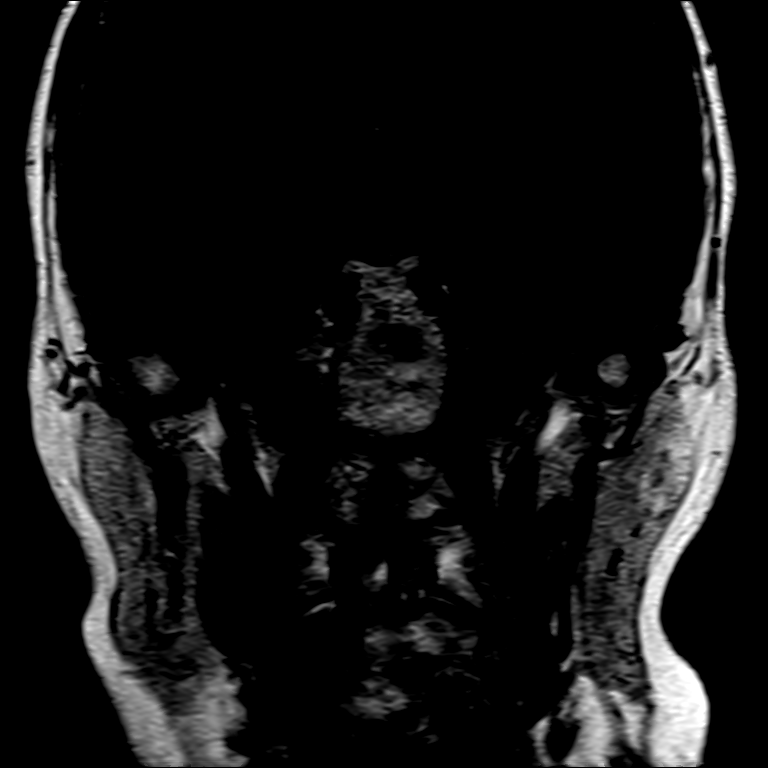

[Series 19: T2 post-contrast · coronal · 5.0mm · 0.57mm/px · 2 of 29 slices shown]
[im 1/29]
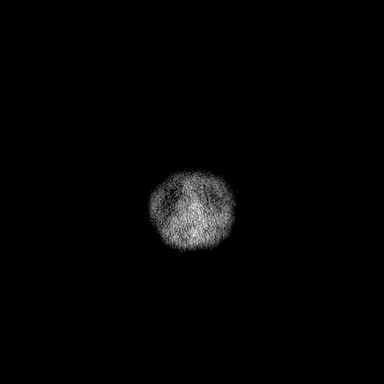
[im 29/29]
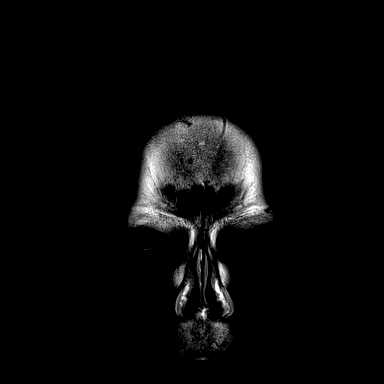

[Series 20: T1 post-contrast · axial · 1.0mm · 0.98mm/px · z∈[-99,+74]mm · 8 of 176 slices shown (1 of 3)]
[im 1/176]
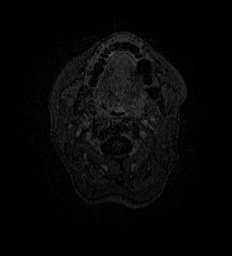
[im 20/176]
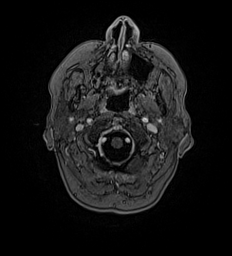
[im 59/176]
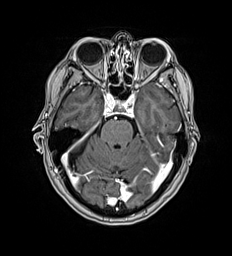
[im 78/176]
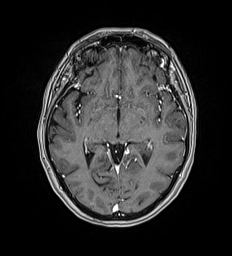
[im 98/176]
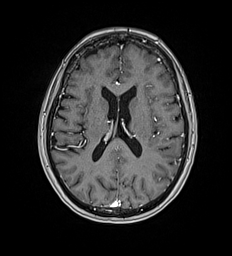
[im 117/176]
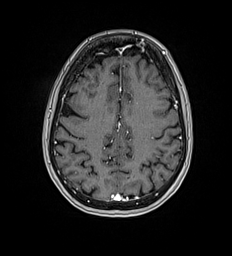
[im 156/176]
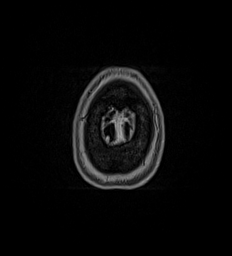
[im 176/176]
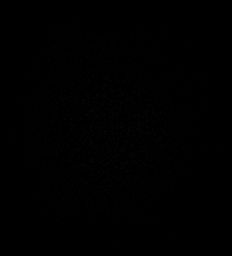

[Series 21: T1 post-contrast · axial · 3.0mm · 0.21mm/px · 1 of 15 slices shown (2 of 3)]
[im 1/15]
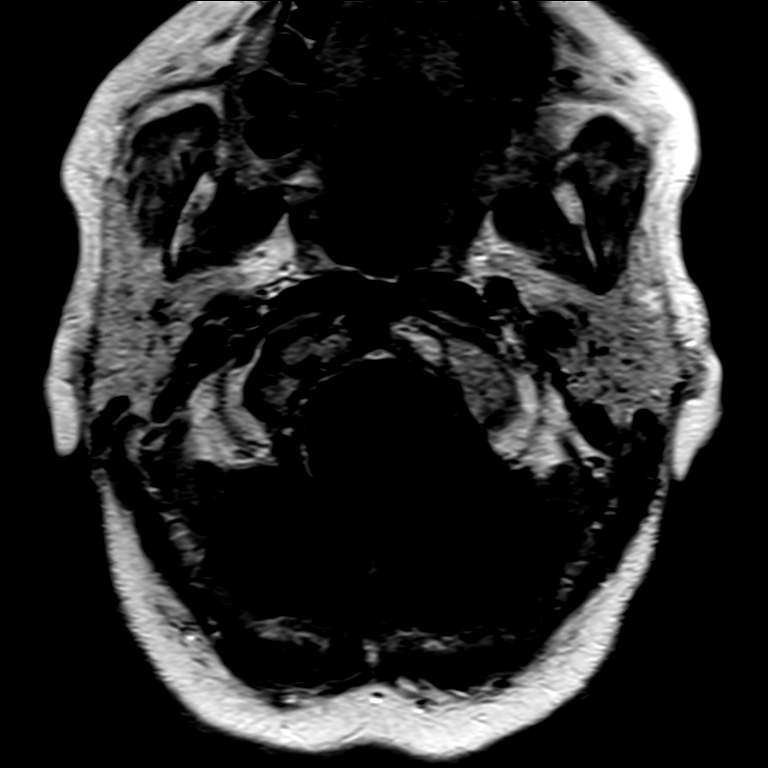

[Series 22: T1 post-contrast · coronal · 3.0mm · 0.21mm/px · 1 of 13 slices shown (3 of 3)]
[im 1/13]
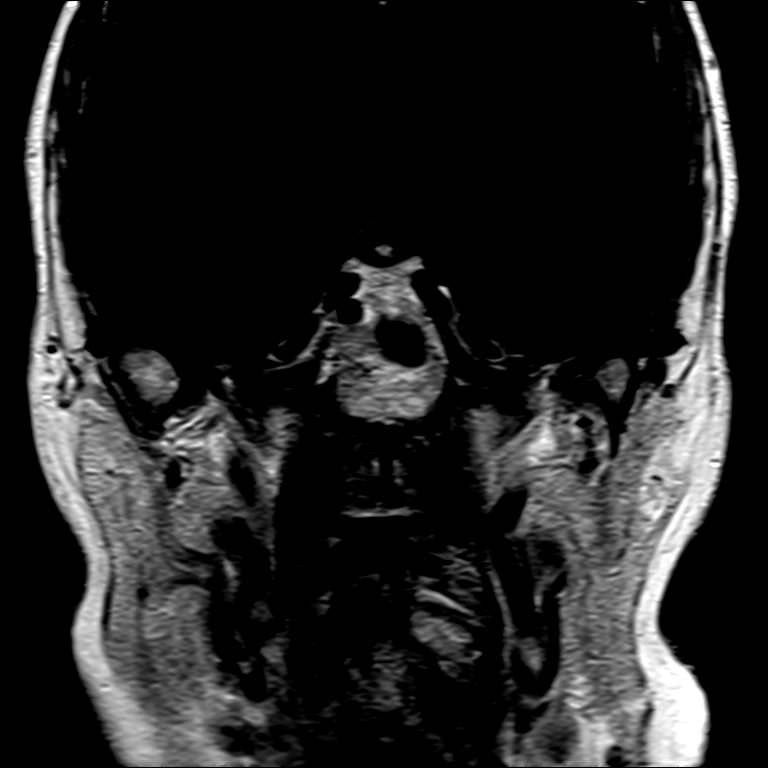

[28 of 48 positions shown; findings below may reference images not displayed]

FINDINGS: MRI HEAD FINDINGS

Brain: Symmetric normal labyrinthine signal. Normal appearance of
the brainstem and cisterns. No infarct, hemorrhage, hydrocephalus,
mass, or brain atrophy.

Vascular: Normal flow voids and vascular enhancements. No evidence
of dural sinus stenosis or diverticulum.

Skull and upper cervical spine: Normal marrow signal.

Sinuses/Orbits: Unremarkable

MRA HEAD FINDINGS

Anterior circulation: Vessels are smoothly contoured and widely
patent. Normal course of the carotids at the skull base. Negative
for aneurysm.

Posterior circulation: Codominant vertebral arteries. No aneurysm,
beading, or altered course.

No evidence of fistula.
IMPRESSION: No explanation for symptoms. No retrocochlear or vascular lesion as
a cause for tinnitus.

## 2021-11-17 MED ORDER — GADOBUTROL 1 MMOL/ML IV SOLN
7.0000 mL | Freq: Once | INTRAVENOUS | Status: AC | PRN
  Administered 2021-11-17: 7 mL via INTRAVENOUS

## 2021-12-30 ENCOUNTER — Other Ambulatory Visit: Payer: Self-pay | Admitting: Family Medicine

## 2022-02-23 ENCOUNTER — Ambulatory Visit
Admission: RE | Admit: 2022-02-23 | Discharge: 2022-02-23 | Disposition: A | Discharge status: Home / Self Care | Payer: Medicare PPO | Source: Ambulatory Visit | Attending: Family Medicine | Admitting: Family Medicine

## 2022-02-23 DIAGNOSIS — Z1231 Encounter for screening mammogram for malignant neoplasm of breast: Secondary | ICD-10-CM | POA: Insufficient documentation

## 2022-02-23 DIAGNOSIS — Z780 Asymptomatic menopausal state: Secondary | ICD-10-CM | POA: Diagnosis present

## 2022-02-23 IMAGING — MG MM DIGITAL SCREENING BILAT W/ TOMO AND CAD
8 series · 8 of 24 positions shown · non-contrast
Comparison: Previous exam(s).

CLINICAL DATA: Screening.

EXAM:
DIGITAL SCREENING BILATERAL MAMMOGRAM WITH TOMOSYNTHESIS AND CAD
TECHNIQUE: Bilateral screening digital craniocaudal and mediolateral oblique
mammograms were obtained. Bilateral screening digital breast
tomosynthesis was performed. The images were evaluated with
computer-aided detection.

[R CC synth-2D]
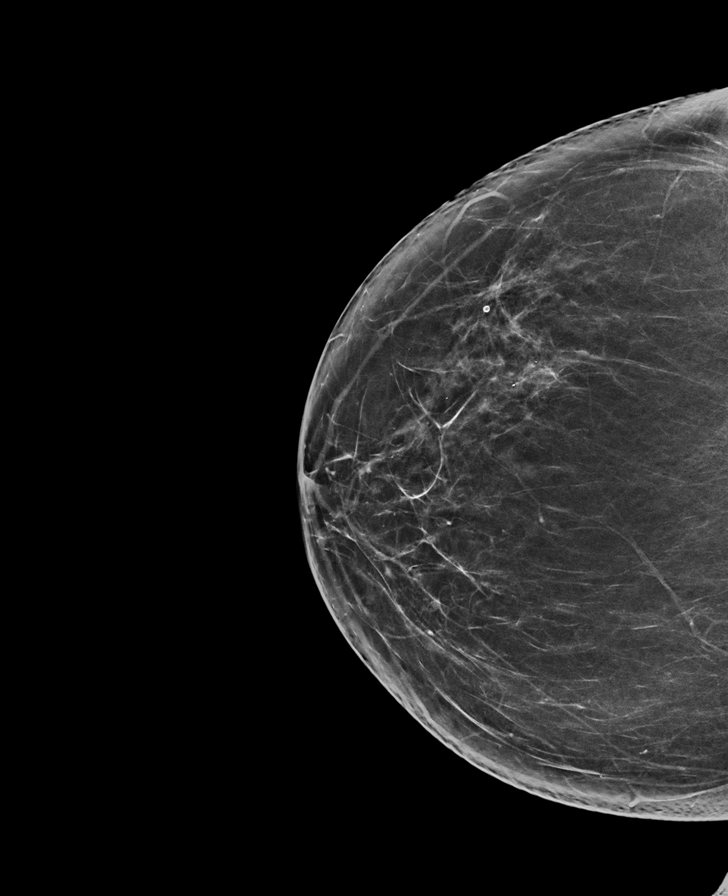

[R MLO synth-2D]
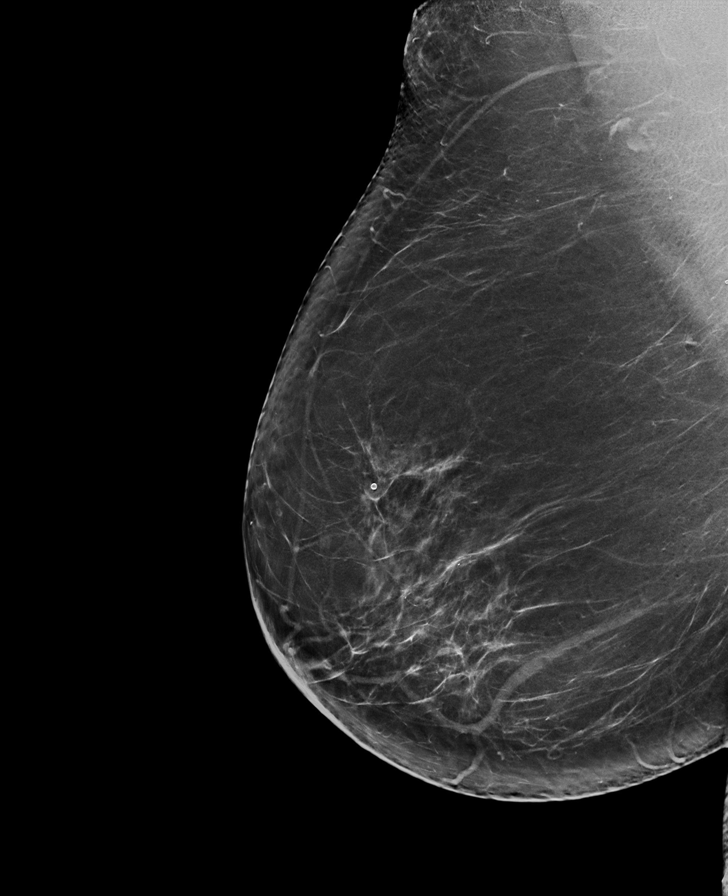

[L CC synth-2D]
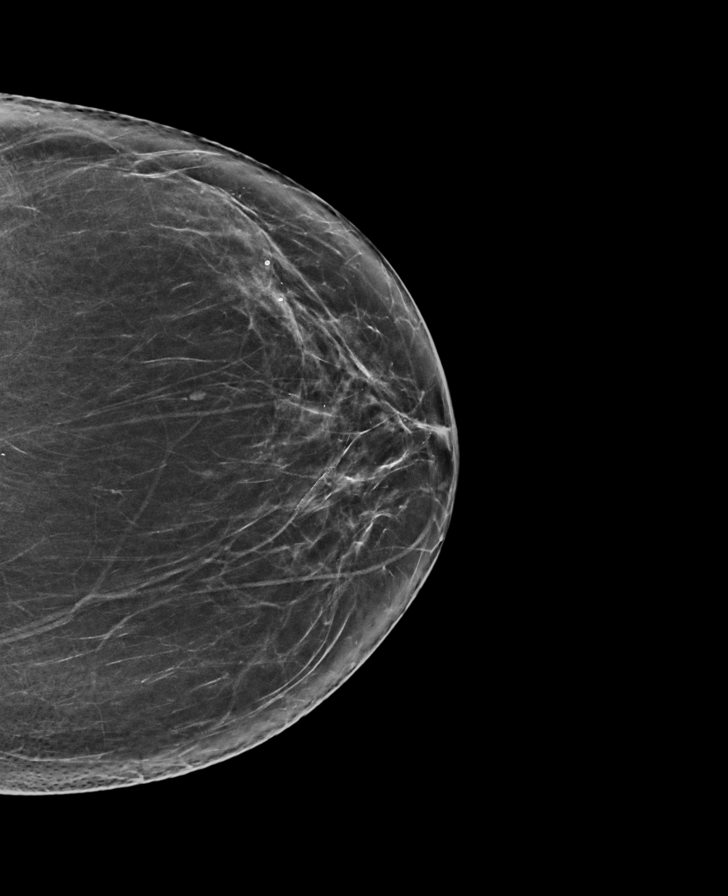

[L MLO synth-2D]
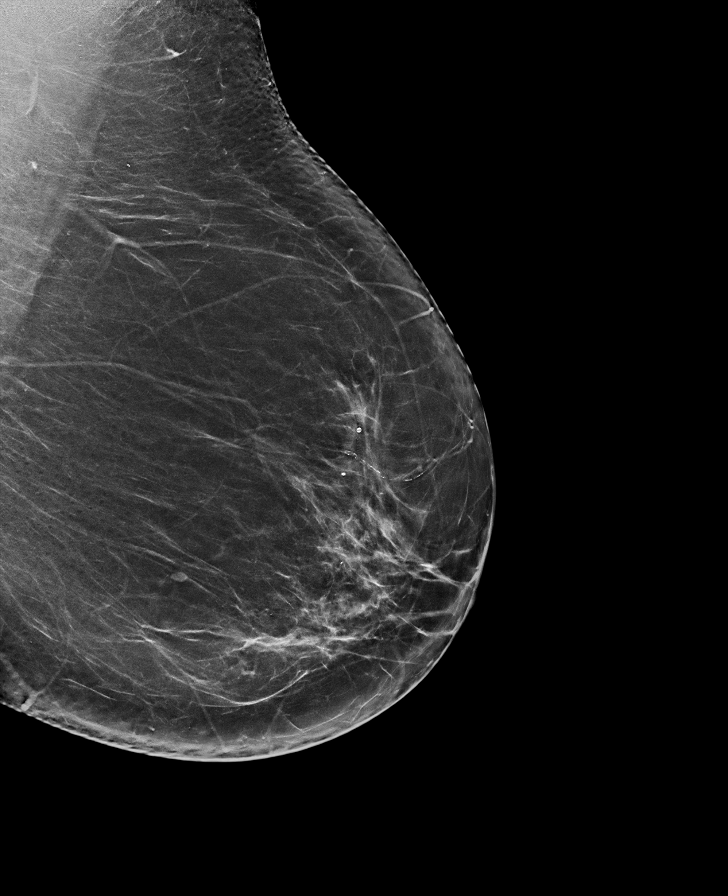

[L CC tomo · tomo slice 37/72.0]
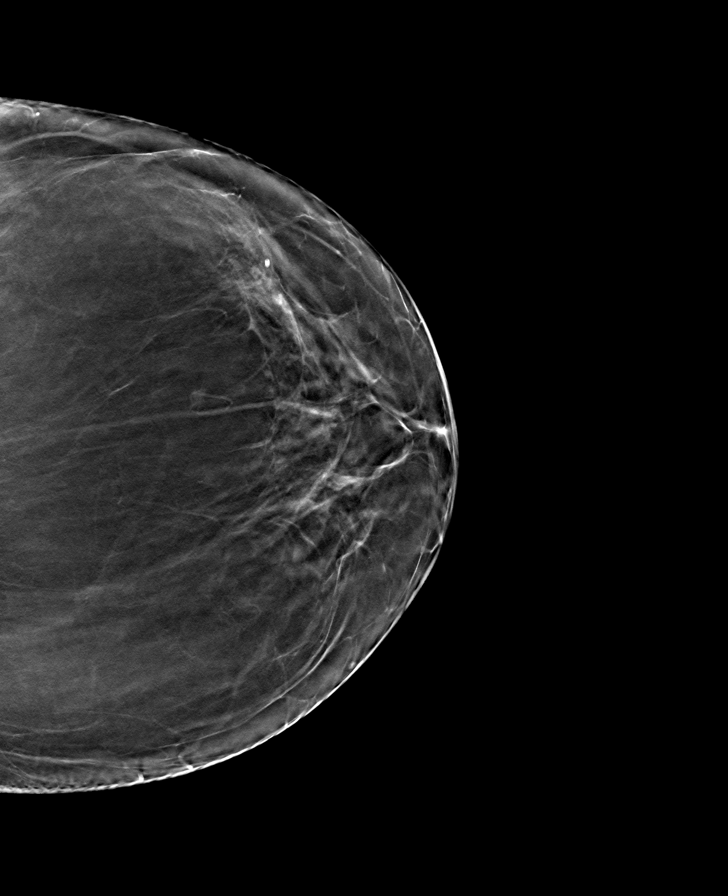

[L MLO tomo · tomo slice 41/81.0]
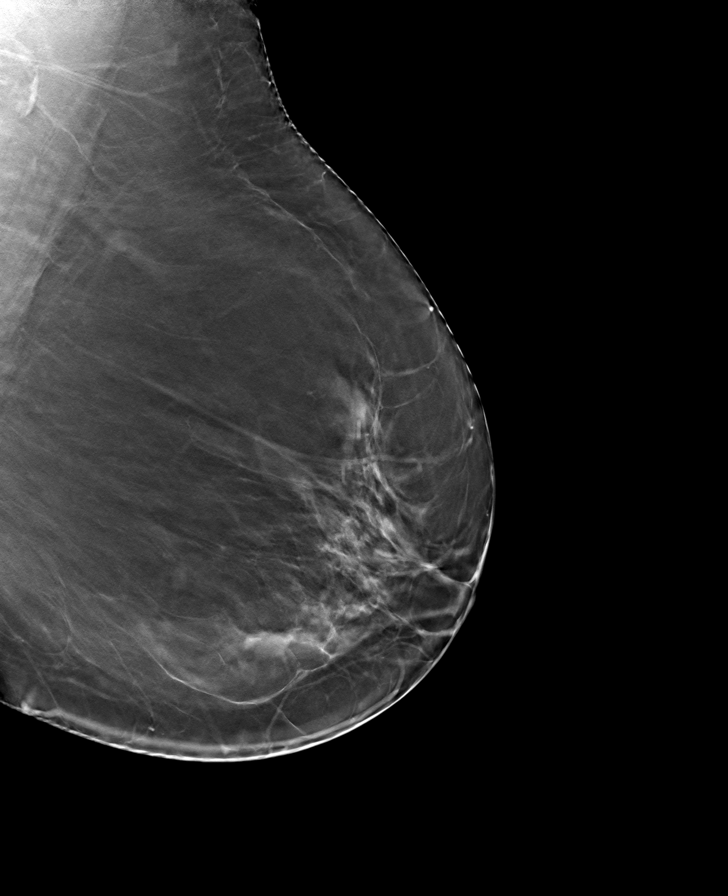

[R MLO tomo · tomo slice 45/89.0]
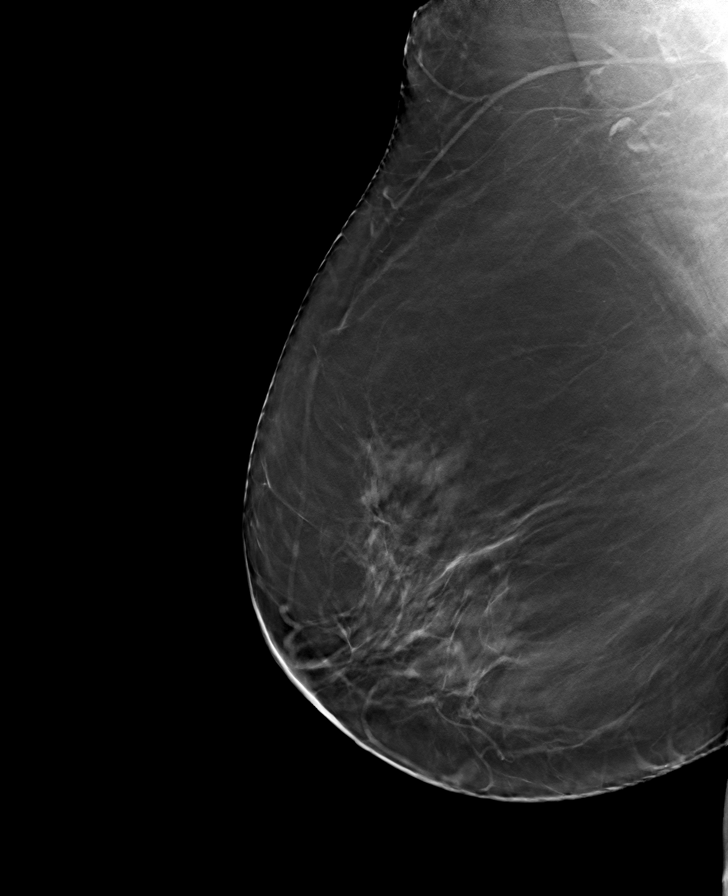

[R CC tomo · tomo slice 37/74.0]
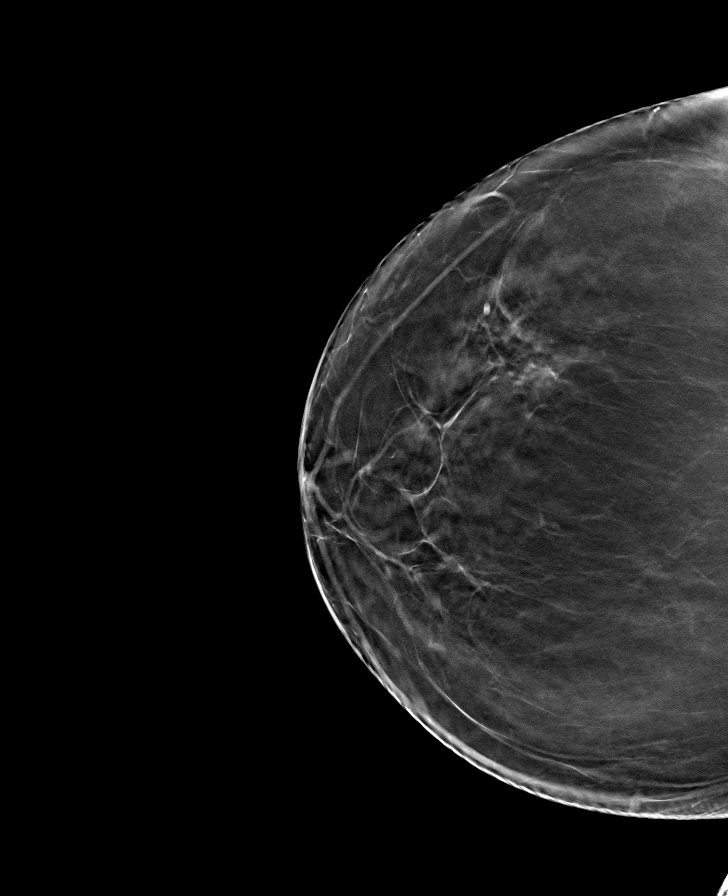

[8 of 24 positions shown; findings below may reference images not displayed]

ACR Breast Density Category b: There are scattered areas of
fibroglandular density.
FINDINGS: There are no findings suspicious for malignancy.
IMPRESSION: No mammographic evidence of malignancy. A result letter of this
screening mammogram will be mailed directly to the patient.

RECOMMENDATION:
Screening mammogram in one year. (Code:[BY])

BI-RADS CATEGORY  1: Negative.

## 2022-02-25 ENCOUNTER — Inpatient Hospital Stay
Admission: RE | Admit: 2022-02-25 | Discharge: 2022-02-25 | Disposition: A | Discharge status: Home / Self Care | Payer: Self-pay | Source: Ambulatory Visit | Attending: *Deleted | Admitting: *Deleted

## 2022-02-25 ENCOUNTER — Other Ambulatory Visit: Payer: Self-pay | Admitting: *Deleted

## 2022-02-25 DIAGNOSIS — Z1231 Encounter for screening mammogram for malignant neoplasm of breast: Secondary | ICD-10-CM

## 2024-02-23 ENCOUNTER — Other Ambulatory Visit: Payer: Self-pay | Admitting: Family Medicine

## 2024-02-23 DIAGNOSIS — Z1231 Encounter for screening mammogram for malignant neoplasm of breast: Secondary | ICD-10-CM

## 2024-03-13 ENCOUNTER — Ambulatory Visit
Admission: RE | Admit: 2024-03-13 | Discharge: 2024-03-13 | Disposition: A | Discharge status: Home / Self Care | Source: Ambulatory Visit | Attending: Family Medicine | Admitting: Family Medicine

## 2024-03-13 DIAGNOSIS — Z1231 Encounter for screening mammogram for malignant neoplasm of breast: Secondary | ICD-10-CM | POA: Insufficient documentation
